# Patient Record
Sex: Female | Born: 1985 | Race: White | Hispanic: No | Marital: Single | Smoking: Never smoker
Health system: Southern US, Community
[De-identification: ages and names within clinical notes are randomized; demographics above are authoritative.]

## PROBLEM LIST (undated history)

## (undated) DIAGNOSIS — F1110 Opioid abuse, uncomplicated: Secondary | ICD-10-CM

## (undated) DIAGNOSIS — R569 Unspecified convulsions: Secondary | ICD-10-CM

---

## 2011-09-04 ENCOUNTER — Emergency Department (INDEPENDENT_AMBULATORY_CARE_PROVIDER_SITE_OTHER): Payer: No Typology Code available for payment source

## 2011-09-04 ENCOUNTER — Encounter (HOSPITAL_BASED_OUTPATIENT_CLINIC_OR_DEPARTMENT_OTHER): Payer: Self-pay | Admitting: Emergency Medicine

## 2011-09-04 ENCOUNTER — Emergency Department (HOSPITAL_BASED_OUTPATIENT_CLINIC_OR_DEPARTMENT_OTHER)
Admission: EM | Admit: 2011-09-04 | Discharge: 2011-09-05 | Disposition: A | Discharge status: Home / Self Care | Payer: No Typology Code available for payment source | Attending: Emergency Medicine | Admitting: Emergency Medicine

## 2011-09-04 DIAGNOSIS — F1910 Other psychoactive substance abuse, uncomplicated: Secondary | ICD-10-CM | POA: Insufficient documentation

## 2011-09-04 DIAGNOSIS — R4182 Altered mental status, unspecified: Secondary | ICD-10-CM

## 2011-09-04 DIAGNOSIS — R51 Headache: Secondary | ICD-10-CM

## 2011-09-04 DIAGNOSIS — S13151A Dislocation of C4/C5 cervical vertebrae, initial encounter: Secondary | ICD-10-CM

## 2011-09-04 DIAGNOSIS — G40909 Epilepsy, unspecified, not intractable, without status epilepticus: Secondary | ICD-10-CM | POA: Insufficient documentation

## 2011-09-04 DIAGNOSIS — R4789 Other speech disturbances: Secondary | ICD-10-CM

## 2011-09-04 DIAGNOSIS — R404 Transient alteration of awareness: Secondary | ICD-10-CM | POA: Insufficient documentation

## 2011-09-04 DIAGNOSIS — S0990XA Unspecified injury of head, initial encounter: Secondary | ICD-10-CM

## 2011-09-04 HISTORY — DX: Unspecified convulsions: R56.9

## 2011-09-04 HISTORY — DX: Opioid abuse, uncomplicated: F11.10

## 2011-09-04 LAB — RAPID URINE DRUG SCREEN, HOSP PERFORMED: Amphetamines: NOT DETECTED

## 2011-09-04 LAB — PREGNANCY, URINE: Preg Test, Ur: NEGATIVE
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
## 2011-09-04 NOTE — ED Notes (Signed)
Pt taken back to tx room via w/c approx 2110 and advised of need for urine and plan for in /out cath per EDNP order-is now agreeable-mother at [facility]
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
## 2011-09-04 NOTE — Discharge Instructions (Signed)
Drug Abuse, Frequently Asked Questions  Drug addiction is a complex brain disease. It is characterized by compulsive, at times uncontrollable, drug craving, seeking, and use that persists even in the face of extremely negative results. Drug seeking becomes compulsive, in large part as a result of the effects of prolonged drug use on brain functioning and, thus, on behavior. For many people, drug addiction becomes chronic, with relapses possible even after long periods of being off the drug.  HOW QUICKLY CAN I BECOME ADDICTED TO A DRUG?  There is no easy answer to this. If and how quickly you might become addicted to a drug depends on many factors including the biology of your body. All drugs are potentially harmful and may have life-threatening consequences associated with their use. There are also vast differences among individuals in sensitivity to various drugs. While one person may use a drug many times and suffer no ill effects, another person may be particularly vulnerable and overdose or developing a craving with the first use. There is no way of knowing in advance how someone may react.  HOW DO I KNOW IF SOMEONE IS ADDICTED TO DRUGS?  If a person is compulsively seeking and using a drug despite negative consequences (such as loss of job, debt, physical problems brought on by drug abuse, or family problems) then he or she is probably addicted. Those who screen for drug problems, such as physicians, have developed the CAGE questionnaire. These four simple questions can help detect substance abuse problems:   Have you ever felt you ought to Cut down on your drinking/drug use?   Have people ever Annoyed you by criticizing your drinking/drug use?   Have you ever felt bad or Guilty about your drinking/drug use?   Have you ever had a drink or taken a drug first thing in the morning to steady your nerves or get rid of a hangover (Eye-opener)?  WHAT ARE THE PHYSICAL SIGNS OF ABUSE OR ADDICTION?  The physical  signs of abuse or addiction can vary depending on the person and the drug being abused. For example, someone who abuses marijuana may have a chronic cough or worsening of asthmatic conditions. THC, the chemical in marijuana responsible for producing its effects, is associated with weakening the immune system which makes the user more vulnerable to infections, such as pneumonia. Each drug has short-term and long-term physical effects. Stimulants like cocaine increase heart rate and blood pressure, whereas opioids like heroin may slow the heart rate and reduce breathing (respiration).   ARE THERE EFFECTIVE TREATMENTS FOR DRUG ADDICTION?  Drug addiction can be effectively treated with behavioral-based therapies and, for addiction to some drugs such as heroin or nicotine, medications may be used. Treatment may vary for each person depending on the type of drug(s) being used and multiple courses of treatment may be needed to achieve success. Research has revealed 13 basic principles that underlie effective drug addiction treatment. These are discussed in NIDA's Principles of Drug Addiction Treatment: A Research-Based Guide.  WHERE CAN I FIND INFORMATION ABOUT DRUG TREATMENT PROGRAMS?   For referrals to treatment programs, visit the Substance Abuse and Mental Health Services Administration online at http://findtreatment.samhsa.gov/.   NIDA publishes an expanding series of treatment manuals, the "clinical toolbox," that gives drug treatment providers research-based information for creating effective treatment programs.  WHAT IS DETOXIFICATION, OR "DETOX"?  Detoxification is the process of allowing the body to rid itself of a drug while managing the symptoms of withdrawal. It is often the first   with a behavioral-based therapy and/or a medication, if available. Detox alone with no follow-up is not treatment.  WHAT IS WITHDRAWAL? HOW LONG DOES IT  LAST? Withdrawal is the variety of symptoms that occur after use of some addictive drugs is reduced or stopped. Length of withdrawal and symptoms vary with the type of drug. For example, physical symptoms of heroin withdrawal may include restlessness, muscle and bone pain, insomnia, diarrhea, vomiting, and cold flashes. These physical symptoms may last for several days, but the general depression, or dysphoria (opposite of euphoria), that often accompanies heroin withdrawal, may last for weeks. In many cases withdrawal can be easily treated with medications to ease the symptoms. But treating withdrawal is not the same as treating addiction.  WHAT ARE THE COSTS OF DRUG ABUSE TO SOCIETY? Beyond the raw numbers are other costs to society:  Spread of infectious diseases such as HIV/AIDS and hepatitis C either through sharing of drug paraphernalia or unprotected sex.   Deaths due to overdose or other complications from drug use.   Effects on unborn children of pregnant drug users.   Other effects such as crime and homelessness.  IF A PREGNANT WOMAN ABUSES DRUGS, DOES IT AFFECT THE FETUS?  Many substances including alcohol, nicotine, and drugs of abuse can have negative effects on the developing fetus because they are transferred to the fetus across the placenta. For example, nicotine has been connected with premature birth and low birth weight, as has the use of cocaine. Scientific studies have shown that babies born to marijuana users were shorter, weighed less, and had smaller head sizes than those born to mothers who did not use the drug. Smaller babies are more likely to develop health problems.   Whether a baby's health problems, if caused by a drug, will continue as the child grows, is not always known. Research does show that children born to mothers who used marijuana regularly during pregnancy may have trouble concentrating, when older. Our research continues to produce insights on the negative  effects of drug use on the fetus.  Document Released: 06/06/2003 Document Revised: 05/23/2011 Document Reviewed: 09/02/2008 [facility] Patient Information 2012 [place], [place].  RESOURCE GUIDE  Dental Problems  Patients with Medicaid: [facility] [phone] [address]                                           [phone] [address] Phone:  [phone]                                                  Phone:  [phone]  If unable to pay or uninsured, contact:  [facility] or [facility]. to become qualified for the adult dental clinic.  Chronic Pain Problems Contact [facility]  [phone] Patients need to be referred by their primary care doctor.  Insufficient Money for Medicine Contact [employer]:  call "211" or [facility] 9846725514.  No Primary Care Doctor Call [facility]  5076093324 Other agencies that provide inexpensive medical care    [facility]  [phone]  [facility]  [phone]    [facility]  [phone]    [facility]  [phone]    [facility]anned [facility]  2604389509    [facility]  [phone]  Psychological Services [facility]  [phone] [facility]  [phone] [facility]   [phone] (emergency services 9150218507)  Substance Abuse Resources [facility]  [phone] [facility] [phone] [facility] [place] [phone] Floydene Flock [phone] Residential & Outpatient Substance Abuse Program  [phone]  Abuse/Neglect [place] [facility] Child Abuse Hotline 9208236692 [facility] Child Abuse Hotline 5810024295 (After Hours)  Emergency Shelter [facility] [phone]  Maternity Homes Room at [facility] [place] [facility] [phone] [facility] Services [phone]  MRSA Hotline #:   4785074629    [facility]  Resources  [facility] [place]     [employer]                          [facility] Dept. [address] [place]                       [address]      [address]  Blondell Reveal Phone:  [phone]                                   Phone:  [phone]                 Phone:  [phone]  [facility] Phone:  [phone]  [facility] Child Abuse Hotline [phone] (After Hours)
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
## 2011-09-05 NOTE — ED Provider Notes (Signed)
Medical screening examination/treatment/procedure(s) were conducted as a shared visit with non-physician practitioner(s) and myself.  I personally evaluated the patient during the encounter   Rachel Sprout, MD 09/05/11 1533

## 2014-10-27 ENCOUNTER — Emergency Department (HOSPITAL_BASED_OUTPATIENT_CLINIC_OR_DEPARTMENT_OTHER): Payer: Self-pay

## 2014-10-27 ENCOUNTER — Encounter (HOSPITAL_BASED_OUTPATIENT_CLINIC_OR_DEPARTMENT_OTHER): Payer: Self-pay

## 2014-10-27 ENCOUNTER — Emergency Department (HOSPITAL_BASED_OUTPATIENT_CLINIC_OR_DEPARTMENT_OTHER)
Admission: EM | Admit: 2014-10-27 | Discharge: 2014-10-27 | Disposition: A | Discharge status: Home / Self Care | Payer: Self-pay | Attending: Emergency Medicine | Admitting: Emergency Medicine

## 2014-10-27 DIAGNOSIS — L03116 Cellulitis of left lower limb: Secondary | ICD-10-CM | POA: Insufficient documentation

## 2014-10-27 DIAGNOSIS — T07XXXA Unspecified multiple injuries, initial encounter: Secondary | ICD-10-CM

## 2014-10-27 DIAGNOSIS — Z23 Encounter for immunization: Secondary | ICD-10-CM | POA: Insufficient documentation

## 2014-10-27 DIAGNOSIS — F1310 Sedative, hypnotic or anxiolytic abuse, uncomplicated: Secondary | ICD-10-CM | POA: Insufficient documentation

## 2014-10-27 DIAGNOSIS — Y9289 Other specified places as the place of occurrence of the external cause: Secondary | ICD-10-CM | POA: Insufficient documentation

## 2014-10-27 DIAGNOSIS — S90812A Abrasion, left foot, initial encounter: Secondary | ICD-10-CM | POA: Insufficient documentation

## 2014-10-27 DIAGNOSIS — Z79899 Other long term (current) drug therapy: Secondary | ICD-10-CM | POA: Insufficient documentation

## 2014-10-27 DIAGNOSIS — Y9389 Activity, other specified: Secondary | ICD-10-CM | POA: Insufficient documentation

## 2014-10-27 DIAGNOSIS — T148 Other injury of unspecified body region: Secondary | ICD-10-CM | POA: Insufficient documentation

## 2014-10-27 DIAGNOSIS — Y998 Other external cause status: Secondary | ICD-10-CM | POA: Insufficient documentation

## 2014-10-27 DIAGNOSIS — S80212A Abrasion, left knee, initial encounter: Secondary | ICD-10-CM | POA: Insufficient documentation

## 2014-10-27 MED ORDER — SULFAMETHOXAZOLE-TRIMETHOPRIM 800-160 MG PO TABS
1.0000 | ORAL_TABLET | Freq: Once | ORAL | Status: AC
  Administered 2014-10-27: 1 via ORAL
  Filled 2014-10-27: qty 1

## 2014-10-27 MED ORDER — SULFAMETHOXAZOLE-TRIMETHOPRIM 800-160 MG PO TABS: 1.0000 | ORAL_TABLET | Freq: Two times a day (BID) | ORAL | Status: AC

## 2014-10-27 MED ORDER — TETANUS-DIPHTH-ACELL PERTUSSIS 5-2.5-18.5 LF-MCG/0.5 IM SUSP
0.5000 mL | Freq: Once | INTRAMUSCULAR | Status: AC
  Administered 2014-10-27: 0.5 mL via INTRAMUSCULAR
  Filled 2014-10-27: qty 0.5
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
## 2014-10-27 NOTE — Discharge Instructions (Signed)
Abrasion °An abrasion is a cut or scrape of the skin. Abrasions do not extend through all layers of the skin and most heal within 10 days. It is important to care for your abrasion properly to prevent infection. °CAUSES  °Most abrasions are caused by falling on, or gliding across, the ground or other surface. When your skin rubs on something, the outer and inner layer of skin rubs off, causing an abrasion. °DIAGNOSIS  °Your caregiver will be able to diagnose an abrasion during a physical exam.  °TREATMENT  °Your treatment depends on how large and deep the abrasion is. Generally, your abrasion will be cleaned with water and a mild soap to remove any dirt or debris. An antibiotic ointment may be put over the abrasion to prevent an infection. A bandage (dressing) may be wrapped around the abrasion to keep it from getting dirty.  °You may need a tetanus shot if: °· You cannot remember when you had your last tetanus shot. °· You have never had a tetanus shot. °· The injury broke your skin. °If you get a tetanus shot, your arm may swell, get red, and feel warm to the touch. This is common and not a problem. If you need a tetanus shot and you choose not to have one, there is a rare chance of getting tetanus. Sickness from tetanus can be serious.  °HOME CARE INSTRUCTIONS  °· If a dressing was applied, change it at least once a day or as directed by your caregiver. If the bandage sticks, soak it off with warm water.   °· Wash the area with water and a mild soap to remove all the ointment 2 times a day. Rinse off the soap and pat the area dry with a clean towel.   °· Reapply any ointment as directed by your caregiver. This will help prevent infection and keep the bandage from sticking. Use gauze over the wound and under the dressing to help keep the bandage from sticking.   °· Change your dressing right away if it becomes wet or dirty.   °· Only take over-the-counter or prescription medicines for pain, discomfort, or fever as  directed by your caregiver.   °· Follow up with your caregiver within 24-48 hours for a wound check, or as directed. If you were not given a wound-check appointment, look closely at your abrasion for redness, swelling, or pus. These are signs of infection. °SEEK IMMEDIATE MEDICAL CARE IF:  °· You have increasing pain in the wound.   °· You have redness, swelling, or tenderness around the wound.   °· You have pus coming from the wound.   °· You have a fever or persistent symptoms for more than 2-3 days. °· You have a fever and your symptoms suddenly get worse. °· You have a bad smell coming from the wound or dressing.   °MAKE SURE YOU:  °· Understand these instructions. °· Will watch your condition. °· Will get help right away if you are not doing well or get worse. °Document Released: 03/13/2005 Document Revised: 05/20/2012 Document Reviewed: 05/07/2011 °ExitCare® Patient Information ©2015 ExitCare, LLC. This information is not intended to replace advice given to you by your health care provider. Make sure you discuss any questions you have with your health care provider. ° °Cellulitis °Cellulitis is an infection of the skin and the tissue beneath it. The infected area is usually red and tender. Cellulitis occurs most often in the arms and lower legs.  °CAUSES  °Cellulitis is caused by bacteria that enter the skin through cracks   or cuts in the skin. The most common types of bacteria that cause cellulitis are staphylococci and streptococci. SIGNS AND SYMPTOMS   Redness and warmth.  Swelling.  Tenderness or pain.  Fever. DIAGNOSIS  Your health care provider can usually determine what is wrong based on a physical exam. Blood tests may also be done. TREATMENT  Treatment usually involves taking an antibiotic medicine. HOME CARE INSTRUCTIONS   Take your antibiotic medicine as directed by your health care provider. Finish the antibiotic even if you start to feel better.  Keep the infected arm or leg  elevated to reduce swelling.  Apply a warm cloth to the affected area up to 4 times per day to relieve pain.  Take medicines only as directed by your health care provider.  Keep all follow-up visits as directed by your health care provider. SEEK MEDICAL CARE IF:   You notice red streaks coming from the infected area.  Your red area gets larger or turns dark in color.  Your bone or joint underneath the infected area becomes painful after the skin has healed.  Your infection returns in the same area or another area.  You notice a swollen bump in the infected area.  You develop new symptoms.  You have a fever. SEEK IMMEDIATE MEDICAL CARE IF:   You feel very sleepy.  You develop vomiting or diarrhea.  You have a general ill feeling (malaise) with muscle aches and pains. MAKE SURE YOU:   Understand these instructions.  Will watch your condition.  Will get help right away if you are not doing well or get worse. Document Released: 03/13/2005 Document Revised: 10/18/2013 Document Reviewed: 08/19/2011 [facility] Patient Information 2015 [place], [place]. This information is not intended to replace advice given to you by your health care provider. Make sure you discuss any questions you have with your health care provider.  Contusion A contusion is a deep bruise. Contusions are the result of an injury that caused bleeding under the skin. The contusion may turn blue, purple, or yellow. Minor injuries will give you a painless contusion, but more severe contusions may stay painful and swollen for a few weeks.  CAUSES  A contusion is usually caused by a blow, trauma, or direct force to an area of the body. SYMPTOMS   Swelling and redness of the injured area.  Bruising of the injured area.  Tenderness and soreness of the injured area.  Pain. DIAGNOSIS  The diagnosis can be made by taking a history and physical exam. An X-ray, CT scan, or MRI may be needed to determine if there were  any associated injuries, such as fractures. TREATMENT  Specific treatment will depend on what area of the body was injured. In general, the best treatment for a contusion is resting, icing, elevating, and applying cold compresses to the injured area. Over-the-counter medicines may also be recommended for pain control. Ask your caregiver what the best treatment is for your contusion. HOME CARE INSTRUCTIONS   Put ice on the injured area.  Put ice in a plastic bag.  Place a towel between your skin and the bag.  Leave the ice on for 15-20 minutes, 3-4 times a day, or as directed by your health care provider.  Only take over-the-counter or prescription medicines for pain, discomfort, or fever as directed by your caregiver. Your caregiver may recommend avoiding anti-inflammatory medicines (aspirin, ibuprofen, and naproxen) for 48 hours because these medicines may increase bruising.  Rest the injured area.  If possible, elevate  the injured area to reduce swelling. SEEK IMMEDIATE MEDICAL CARE IF:   You have increased bruising or swelling.  You have pain that is getting worse.  Your swelling or pain is not relieved with medicines. MAKE SURE YOU:   Understand these instructions.  Will watch your condition.  Will get help right away if you are not doing well or get worse. Document Released: 03/13/2005 Document Revised: 06/08/2013 Document Reviewed: 04/08/2011 [facility] Patient Information 2015 [place]. This information is not intended to replace advice given to you by your health care provider. Make sure you discuss any questions you have with your health care provider.  Polysubstance Abuse When people abuse more than one drug or type of drug it is called polysubstance or polydrug abuse. For example, many smokers also drink alcohol. This is one form of polydrug abuse. Polydrug abuse also refers to the use of a drug to counteract an unpleasant effect produced by another drug. It may  also be used to help with withdrawal from another drug. People who take stimulants may become agitated. Sometimes this agitation is countered with a tranquilizer. This helps protect against the unpleasant side effects. Polydrug abuse also refers to the use of different drugs at the same time.  Anytime drug use is interfering with normal living activities, it has become abuse. This includes problems with family and friends. Psychological dependence has developed when your mind tells you that the drug is needed. This is usually followed by physical dependence which has developed when continuing increases of drug are required to get the same feeling or "high". This is known as addiction or chemical dependency. A person's risk is much higher if there is a history of chemical dependency in the family. SIGNS OF CHEMICAL DEPENDENCY  You have been told by friends or family that drugs have become a problem.  You fight when using drugs.  You are having blackouts (not remembering what you do while using).  You feel sick from using drugs but continue using.  You lie about use or amounts of drugs (chemicals) used.  You need chemicals to get you going.  You are suffering in work performance or in school because of drug use.  You get sick from use of drugs but continue to use anyway.  You need drugs to relate to people or feel comfortable in social situations.  You use drugs to forget problems. "Yes" answered to any of the above signs of chemical dependency indicates there are problems. The longer the use of drugs continues, the greater the problems will become. If there is a family history of drug or alcohol use, it is best not to experiment with these drugs. Continual use leads to tolerance. After tolerance develops more of the drug is needed to get the same feeling. This is followed by addiction. With addiction, drugs become the most important part of life. It becomes more important to take drugs than  participate in the other usual activities of life. This includes relating to friends and family. Addiction is followed by dependency. Dependency is a condition where drugs are now needed not just to get high, but to feel normal. Addiction cannot be cured but it can be stopped. This often requires outside help and the care of professionals. Treatment centers are listed in the yellow pages under: Cocaine, Narcotics, and Alcoholics Anonymous. Most hospitals and clinics can refer you to a specialized care center. Talk to your caregiver if you need help. Document Released: 01/23/2005 Document Revised: 08/26/2011 Document  Reviewed: 06/03/2005 ExitCare Patient Information 2015 [place], [place]. This information is not intended to replace advice given to you by your health care provider. Make sure you discuss any questions you have with your health care provider.    Emergency Department Resource Guide 1) Find a Doctor and Pay Out of Pocket Although you won't have to find out who is covered by your insurance plan, it is a good idea to ask around and get recommendations. You will then need to call the office and see if the doctor you have chosen will accept you as a new patient and what types of options they offer for patients who are self-pay. Some doctors offer discounts or will set up payment plans for their patients who do not have insurance, but you will need to ask so you aren't surprised when you get to your appointment.  2) Contact Your Local Health Department Not all health departments have doctors that can see patients for sick visits, but many do, so it is worth a call to see if yours does. If you don't know where your local health department is, you can check in your phone book. The CDC also has a tool to help you locate your state's health department, and many state websites also have listings of all of their local health departments.  3) Find a Walk-in Clinic If your illness is not likely to be  very severe or complicated, you may want to try a walk in clinic. These are popping up all over the country in pharmacies, drugstores, and shopping centers. They're usually staffed by nurse practitioners or physician assistants that have been trained to treat common illnesses and complaints. They're usually fairly quick and inexpensive. However, if you have serious medical issues or chronic medical problems, these are probably not your best option.  No Primary Care Doctor: - Call Health Connect at  [phone] - they can help you locate a primary care doctor that  accepts your insurance, provides certain services, etc. - Physician Referral Service- [phone]  Chronic Pain Problems: Organization         Address  Phone   Notes  [facility]  [phone] Patients need to be referred by their primary care doctor.   Medication Assistance: Organization         Address  Phone   Notes  [facility] Medication [facility] [address] [place] [place], [place] [address] [phone] --Must be a resident of [facility] -- Must have NO insurance coverage whatsoever (no Medicaid/ Medicare, etc.) -- The pt. MUST have a primary care doctor that directs their care regularly and follows them in the community   [facility]ist  [phone]   [employer]  [phone]    Agencies that provide inexpensive medical care: Organization         Address  Phone   Notes  [facility]  [phone]   [facility]    [phone]   [facility] [address] [place], [place] [address] [phone]   [facility] [address] [place] [place] [phone]   Planned Parenthood    [phone]   [facility]    [phone]   Community [facility] and [facility]  [address], [place] Phone:  [phone], Fax:  [phone] Hours of Operation:  9 am - 6 pm, M-F.  Also  accepts Medicaid/Medicare and self-pay.  [facility]  [address], [place] Phone: [phone]) [phone], Fax: [phone](336) [phone]. Hours of Operation:  8:30 am - 5:30 pm, M-F.  Also accepts Medicaid and self-pay.  [facility] [place] [address], [place] Phone: [phone](336) [phone]   [facility] [address], [place], [place] [phone], Ext. [phone] Mondays & Thursdays: 7-9 AM.  First 15 patients are seen on a first come, first serve basis.    Medicaid-accepting [place] [facility] Providers:  Organization         Address  Phone   Notes  [facility] [address], [place] [phone] Also accepts self-pay patients.  [facility] [address], [place]  [phone]   [facility] [address], [place] [phone]) [phone]   [facility] [address] [place] [address], [place] [phone]) [phone]   Renaye RakersVeita Bland [address], [place]   [phone] Only accepts [place] Access [place] patients after they have their name applied to their card.   Self-Pay (no insurance) in [place] [facility]:  Organization         Address  Phone   Notes  Sickle Cell Patients, [facility] [address], [place] [phone]) [phone]   [facility] [address], [place] [phone]) [phone]   [facility] [place]  [address], [place] [phone]) [phone]   [facility]/Dr. Osei-Bonsu  [address] [place] [address], [place] or [phone], [place] [phone] Phone number for both [place] and [place] locations is the same.  [facility] [address], [place] [phone]   [facility]  [address] [place] [address], [place] or [address] [phone]) [phone]) [phone]   [facility] [address],  [place] [phone], phone; [phone](336) [phone], fax Sees patients 1st and 3rd Saturday of every month.  Must not qualify for public or private insurance (i.e. Medicaid, Medicare, [place] Health Choice, Veterans' Benefits)  Household income should be no more than 200% of the poverty level The clinic cannot treat you if you are pregnant or think you are pregnant  Sexually transmitted diseases are not treated at the clinic.    Dental Care: Organization         Address  Phone  Notes  [facility] [place] [facility] [address] [facility] [address], [place] [phone] Accepts children up to age 10421 who are enrolled in [place] or [place] Health Choice; pregnant women with a Medicaid card; and children who have applied for Medicaid or [place] Health Choice, but were declined, whose parents can pay a reduced fee at time of service.  [facility] [place]  [address], [place] [phone] Accepts children up to age 27 who are enrolled in [place] or [place] Health Choice; pregnant women with a Medicaid card; and children who have applied for Medicaid or [place] Health Choice, but were declined, whose parents can pay a reduced fee at time of service.  [facility]  [address], [place] [phone]) [phone] Patients are seen by appointment only. Walk-ins are not accepted. [facility] will see patients 27 years of age and older. Monday - Tuesday (8am-5pm) Most Wednesdays (8:30-5pm) $30 per visit,  cash only  [employer] [facility] [employer] [facility]  [address], [place] [phone] Patients are seen by appointment only. Walk-ins are not accepted. [facility] will see patients 76 years of age and older. One Wednesday Evening (Monthly: Volunteer Based).  $30 per visit, cash only  [employer]  [phone] for adults; Children under age 73, call Graduate Pediatric Dentistry at [phone].  Children aged 65-14, please call [phone] to request a pediatric application.  Dental services are provided in all areas of dental care including fillings, crowns and bridges, complete and partial dentures, implants, gum treatment, root canals, and extractions. Preventive care is also provided. Treatment is provided to both adults and children. Patients are selected via a lottery and there is often a waiting list.   [facility] [address] [place]  [phone] [email or web address]   [facility] [address] [place], [place] [phone], Ext. [phone] Second and Fourth Thursday of each month, opens at 6:30 AM; Clinic ends at 9 AM.  Patients are seen on a first-come first-served basis, and a limited number are seen during each clinic.   [facility]  [address] [place], [place] [phone]   Eligibility Requirements You must have lived in [place], [place], or [place] counties for at least the last three months.   You cannot be eligible for state or federal sponsored [place], including [employer], [place], or [employer].   You generally cannot be eligible for healthcare insurance through your employer.    How to apply: Eligibility screenings are held every Tuesday and Wednesday afternoon from 1:00 pm until 4:00 pm. You do not need an appointment for the interview!  [facility] [address], [place], [place] [phone]   [facility]  [phone]   [facility]  [phone]   [facility]  [phone]    Behavioral Health Resources in the Community: Intensive Outpatient Programs Organization         Address  Phone  Notes  [facility] [address], [place], [place] [phone]   [facility]patient [address] [place], [place] [phone]   [facility] [address], [place], [place]  [phone]   [facility] [address],  [place], [place] [phone] or [phone]   Substance Abuse Resources Organization         Address  Phone  Notes  [facility]  [phone]   [facility]  [phone]   [facility]  [phone]   Floydene Flock  [phone]   Residential & Outpatient Substance Abuse Program  [phone]   Psychological Services Organization         Address  Phone  Notes  [facility]  [phone]   [facility] Services  [phone]   [facility] Mental Health [address], [place] [phone] or [phone]    Mobile Crisis Teams Organization         Address  Phone  Notes  [facility], Mobile Crisis Care Unit  [phone]   [facility]  [address] [place], [place] [phone]   Doristine Locks [address] [place] [phone]    Self-Help/Support Groups Organization         Address  Phone  Notes  [facility] [place] - variety of support groups  [phone] Call for more information  Narcotics Anonymous (NA), [facility] [address], [employer] [place]  2 meetings at this location   Statistician         Address  Phone  Notes  [facility] [address],    [place]  [phone]   [facility]  [address] [place] [address], [place], [place] [phone]   [facility] [address] [place], [place] [phone] Admissions: 8am-3pm M-F  [facility] [address],    [place], [place] [phone]   [facility] [address] [place], [place], [place] [phone]   [facility] [address]  [place], [place] [phone]   [facility] - Intensive Outpatient [address], [place], [place] [phone]     [facility] [address]  [place], [place] [phone] or [phone]   [facility] ([facility]) [address], [place], [place] [phone] Accepts Medicaid  [facility] [address],  [place] [phone] Substance Abuse/Addiction Treatment   [facility] Organization         Address  Phone  Notes  [facility]  [phone]   Angie Fava, PhD [address] [place], [place]   [phone] or [phone]   [facility]   [address] [place], [place] [phone]   [facility] [address] [place], [place] [phone] Insurance/Medicaid/sponsorship through [place] [facility] [address]                                    [place], [place] [phone] Therapy/tele-psych/case  [facility] [address], [place] [phone]    Dr. Lolly Mustache  [phone]   [facility]  [employer] [facility] Dept. 1) [address] [place] 2) [address] [place] 3)  [address], [place] [phone]  [phone]   [facility] Child Abuse Hotline [phone] or [phone] (After Hours)
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
## 2014-11-06 ENCOUNTER — Emergency Department (HOSPITAL_BASED_OUTPATIENT_CLINIC_OR_DEPARTMENT_OTHER): Payer: Self-pay

## 2014-11-06 ENCOUNTER — Encounter (HOSPITAL_BASED_OUTPATIENT_CLINIC_OR_DEPARTMENT_OTHER): Payer: Self-pay | Admitting: *Deleted

## 2014-11-06 ENCOUNTER — Emergency Department (HOSPITAL_BASED_OUTPATIENT_CLINIC_OR_DEPARTMENT_OTHER)
Admission: EM | Admit: 2014-11-06 | Discharge: 2014-11-06 | Disposition: A | Discharge status: Home / Self Care | Payer: Self-pay | Attending: Emergency Medicine | Admitting: Emergency Medicine

## 2014-11-06 DIAGNOSIS — W208XXD Other cause of strike by thrown, projected or falling object, subsequent encounter: Secondary | ICD-10-CM | POA: Insufficient documentation

## 2014-11-06 DIAGNOSIS — S91302D Unspecified open wound, left foot, subsequent encounter: Secondary | ICD-10-CM | POA: Insufficient documentation

## 2014-11-06 DIAGNOSIS — Z79899 Other long term (current) drug therapy: Secondary | ICD-10-CM | POA: Insufficient documentation

## 2014-11-06 DIAGNOSIS — Z8669 Personal history of other diseases of the nervous system and sense organs: Secondary | ICD-10-CM | POA: Insufficient documentation

## 2014-11-06 MED ORDER — CEPHALEXIN 500 MG PO CAPS: 500.0000 mg | ORAL_CAPSULE | Freq: Four times a day (QID) | ORAL | Status: AC

## 2014-11-06 NOTE — ED Notes (Signed)
Dr. Judd Lienelo in to see pt at time of d/c, pt denies questions or needs, , given ortho shoe and Rx x1.

## 2014-11-06 NOTE — ED Notes (Signed)
Pt to xray via stretcher

## 2014-11-06 NOTE — ED Notes (Signed)
Back from xray, no changes.  ?

## 2014-11-06 NOTE — Discharge Instructions (Signed)
Keflex as prescribed.  Keep your wound clean, dry, and covered.  Apply bacitracin and perform dressing changes twice daily.  Warm soaks twice daily.  Follow-up with podiatry for a wound check in the next week.   Wound Check Your wound appears healthy today. Your wound will heal gradually over time. Eventually a scar will form that will fade with time. FACTORS THAT AFFECT SCAR FORMATION:  People differ in the severity in which they scar.  Scar severity varies according to location, size, and the traits you inherited from your parents (genetic predisposition).  Irritation to the wound from infection, rubbing, or chemical exposure will increase the amount of scar formation. HOME CARE INSTRUCTIONS   If you were given a dressing, you should change it at least once a day or as instructed by your caregiver. If the bandage sticks, soak it off with a solution of hydrogen peroxide.  If the bandage becomes wet, dirty, or develops a bad smell, change it as soon as possible.  Look for signs of infection.  Only take over-the-counter or prescription medicines for pain, discomfort, or fever as directed by your caregiver. SEEK IMMEDIATE MEDICAL CARE IF:   You have redness, swelling, or increasing pain in the wound.  You notice pus coming from the wound.  You have a fever.  You notice a bad smell coming from the wound or dressing. Document Released: 03/09/2004 Document Revised: 08/26/2011 Document Reviewed: 06/03/2005 [facility] Patient Information 2015 [employer], [place]. This information is not intended to replace advice given to you by your health care provider. Make sure you discuss any questions you have with your health care provider.
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
## 2014-11-06 NOTE — ED Notes (Signed)
Dr. Judd Liendelo in to see pt

## 2014-11-06 NOTE — ED Notes (Signed)
Given ortho shoe, in place at time of d/c.
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
## 2018-04-05 ENCOUNTER — Encounter (HOSPITAL_BASED_OUTPATIENT_CLINIC_OR_DEPARTMENT_OTHER): Payer: Self-pay | Admitting: Emergency Medicine

## 2018-04-05 ENCOUNTER — Emergency Department (HOSPITAL_BASED_OUTPATIENT_CLINIC_OR_DEPARTMENT_OTHER)
Admission: EM | Admit: 2018-04-05 | Discharge: 2018-04-05 | Disposition: A | Discharge status: Home / Self Care | Payer: Self-pay | Attending: Emergency Medicine | Admitting: Emergency Medicine

## 2018-04-05 ENCOUNTER — Other Ambulatory Visit: Payer: Self-pay

## 2018-04-05 DIAGNOSIS — R220 Localized swelling, mass and lump, head: Secondary | ICD-10-CM | POA: Insufficient documentation

## 2018-04-05 DIAGNOSIS — L0291 Cutaneous abscess, unspecified: Secondary | ICD-10-CM | POA: Insufficient documentation

## 2018-04-05 MED ORDER — DOXYCYCLINE HYCLATE 100 MG PO CAPS: 100.0000 mg | ORAL_CAPSULE | Freq: Two times a day (BID) | ORAL | 0 refills | Status: AC

## 2018-04-05 MED ORDER — DOXYCYCLINE HYCLATE 100 MG PO CAPS: 100.0000 mg | ORAL_CAPSULE | Freq: Two times a day (BID) | ORAL | 0 refills | Status: DC

## 2018-04-05 MED ORDER — LIDOCAINE HCL (PF) 1 % IJ SOLN
5.0000 mL | Freq: Once | INTRAMUSCULAR | Status: AC
  Administered 2018-04-05: 5 mL
  Filled 2018-04-05: qty 5

## 2018-04-05 NOTE — ED Triage Notes (Signed)
Abscess above R eye. States she took 4 days worth of her boyfriends old amoxicillin with no change.

## 2018-04-05 NOTE — ED Provider Notes (Signed)
[facility] EMERGENCY DEPARTMENT Provider Note   CSN: [id number] Arrival date & time: 04/05/18  0932     History   Chief Complaint Chief Complaint  Patient presents with  . Abscess    HPI Rachel David is a 32 y.o. female.  HPI Patient presented to the emergency room for evaluation of swelling above her right eye.  Patient states initially she had a small pimple letter this week.  Patient tried squeezing the area but started to get larger.  She also started taking amoxicillin that was at the house.  This morning when she woke up she had significant swelling and with some surrounding erythema so she came to the emergency room.  She denies any fevers or chills.  No difficulty with her vision. Past Medical History:  Diagnosis Date  . Narcotic abuse (HCC)   . Seizures (HCC)     There are no active problems to display for this patient.   History reviewed. No pertinent surgical history.   OB History   None      Home Medications    Prior to Admission medications   Medication Sig Start Date End Date Taking? Authorizing Provider  cephALEXin (KEFLEX) 500 MG capsule Take 1 capsule (500 mg total) by mouth 4 (four) times daily. 11/06/14   Geoffery Lyons, MD  doxycycline (VIBRAMYCIN) 100 MG capsule Take 1 capsule (100 mg total) by mouth 2 (two) times daily. 04/05/18   Linwood Dibbles, MD  methadone (DOLOPHINE) 10 MG tablet Take 10 mg by mouth every 8 (eight) hours.    [provider]  METHADONE HCL PO Take 70 mg by mouth daily. Patient is taking this medication per her treatment.    [provider]    Family History No family history on file.  Social History Social History   Tobacco Use  . Smoking status: Never Smoker  . Smokeless tobacco: Never Used  Substance Use Topics  . Alcohol use: No  . Drug use: Yes    Comment: methadone clininc x 4 months     Allergies   Patient has no known allergies.   Review of Systems Review of Systems  All  other systems reviewed and are negative.    Physical Exam Updated Vital Signs BP (!) 130/92 (BP Location: Left Arm)   Pulse 84   Temp 98.3 F (36.8 C) (Oral)   Resp 18   Ht 1.575 m (5\' 2" )   Wt 62.1 kg   LMP 03/06/2018   SpO2 100%   BMI 25.06 kg/m   Physical Exam  Constitutional: She appears well-developed and well-nourished. No distress.  HENT:  Head: Normocephalic and atraumatic.  Right Ear: External ear normal.  Left Ear: External ear normal.  Eyes: Conjunctivae are normal. Right eye exhibits no discharge. Left eye exhibits no discharge. No scleral icterus.  Small marble sized area of swelling within eyebrow, erythema and fluctuance,   Neck: Neck supple. No tracheal deviation present.  Cardiovascular: Normal rate.  Pulmonary/Chest: Effort normal. No stridor. No respiratory distress.  Abdominal: She exhibits no distension.  Musculoskeletal: She exhibits no edema.  Neurological: She is alert. Cranial nerve deficit: no gross deficits.  Skin: Skin is warm and dry. No rash noted.  Psychiatric: She has a normal mood and affect.  Nursing note and vitals reviewed.    ED Treatments / Results  Labs (all labs ordered are listed, but only abnormal results are displayed) Labs Reviewed - No data to display  EKG None  Radiology No  results found.  Procedures .Marland KitchenIncision and Drainage Date/Time: 04/05/2018 10:56 AM Performed by: Linwood Dibbles, MD Authorized by: Linwood Dibbles, MD   Consent:    Consent obtained:  Verbal   Consent given by:  Patient   Risks discussed:  Bleeding, incomplete drainage, pain and damage to other organs   Alternatives discussed:  No treatment Universal protocol:    Procedure explained and questions answered to patient or proxy's satisfaction: yes     Relevant documents present and verified: yes     Test results available and properly labeled: yes     Imaging studies available: yes     Required blood products, implants, devices, and special equipment  available: yes     Site/side marked: yes     Immediately prior to procedure a time out was called: yes     Patient identity confirmed:  Verbally with patient Location:    Type:  Abscess Pre-procedure details:    Skin preparation:  Betadine Anesthesia (see MAR for exact dosages):    Anesthesia method:  Local infiltration   Local anesthetic:  Lidocaine 1% w/o epi Procedure type:    Complexity:  Simple Procedure details:    Needle aspiration: no     Incision types:  Single straight   Incision depth:  Subcutaneous   Scalpel blade:  11   Wound management:  Probed and deloculated, irrigated with saline and extensive cleaning   Drainage:  Purulent   Drainage amount:  Moderate   Wound treatment:  Wound left open   Packing materials:  None Post-procedure details:    Patient tolerance of procedure:  Tolerated well, no immediate complications   (including critical care time)  Medications Ordered in ED Medications  lidocaine (PF) (XYLOCAINE) 1 % injection 5 mL (5 mLs Infiltration Given 04/05/18 1026)     Initial Impression / Assessment and Plan / ED Course  I have reviewed the triage vital signs and the nursing notes.  Pertinent labs & imaging results that were available during my care of the patient were reviewed by me and considered in my medical decision making (see chart for details).   Patient had a small abscess within the right eyebrow.  Small amount of pus obtained status post incision and drainage.  No signs of surrounding cellulitis.  Will discharge home with a course of doxycycline. Final Clinical Impressions(s) / ED Diagnoses   Final diagnoses:  Abscess    ED Discharge Orders         Ordered    doxycycline (VIBRAMYCIN) 100 MG capsule  2 times daily     04/05/18 1051           Linwood Dibbles, MD 04/05/18 1058

## 2018-04-05 NOTE — Discharge Instructions (Addendum)
Take the antibiotics as prescribed, apply antibiotic ointment topically daily and try applying warm compresses to the area, monitor for fever, worsening symptoms
